# Patient Record
Sex: Male | Born: 1935 | Race: White | Hispanic: No
Health system: Southern US, Community
[De-identification: ages and names within clinical notes are randomized; demographics above are authoritative.]

---

## 2005-01-18 ENCOUNTER — Ambulatory Visit: Payer: Self-pay

## 2008-02-14 ENCOUNTER — Emergency Department: Payer: Self-pay | Admitting: Unknown Physician Specialty

## 2010-08-13 ENCOUNTER — Emergency Department: Payer: Self-pay | Admitting: Emergency Medicine

## 2010-09-18 ENCOUNTER — Ambulatory Visit: Payer: Self-pay | Admitting: Urology

## 2010-12-09 ENCOUNTER — Observation Stay: Payer: Self-pay | Admitting: Family Medicine

## 2011-06-18 ENCOUNTER — Emergency Department: Payer: Self-pay | Admitting: *Deleted

## 2012-09-18 IMAGING — CT CT HEAD WITHOUT CONTRAST
2 series · 16 of 30 positions shown, 20 images · non-contrast
Comparison: none

REASON FOR EXAM: headache
COMMENTS:

[Series 2: without · axial · non-contrast · 0.45mm/px · z∈[-122,+8]mm · 13 of 32 slices shown, 17 images]
[im 3/32  brain]
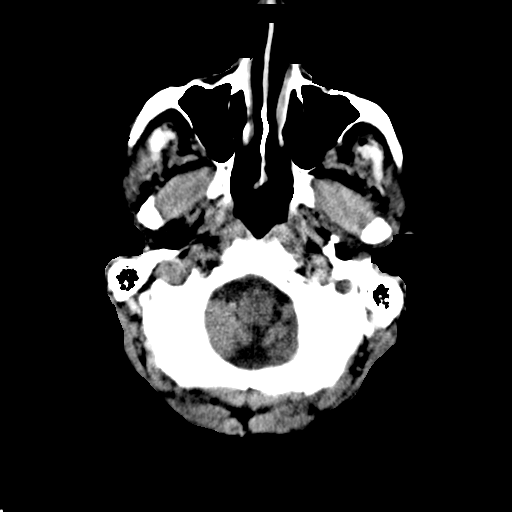
[im 3/32  bone]
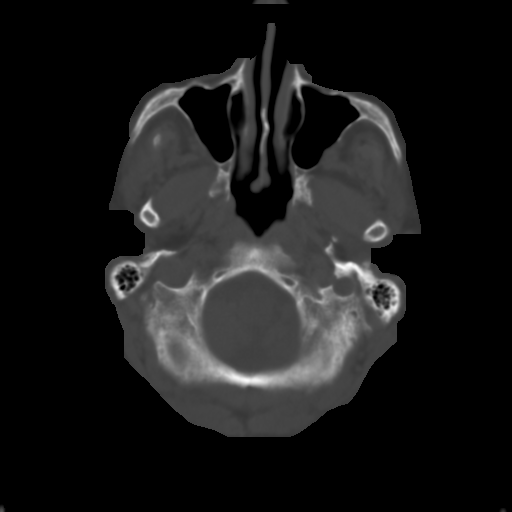
[im 5/32  brain]
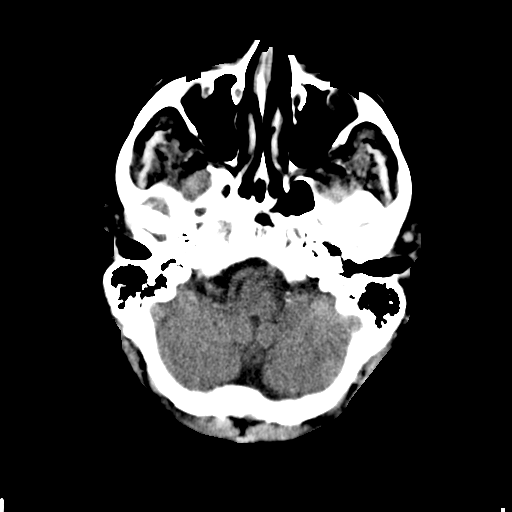
[im 7/32  brain]
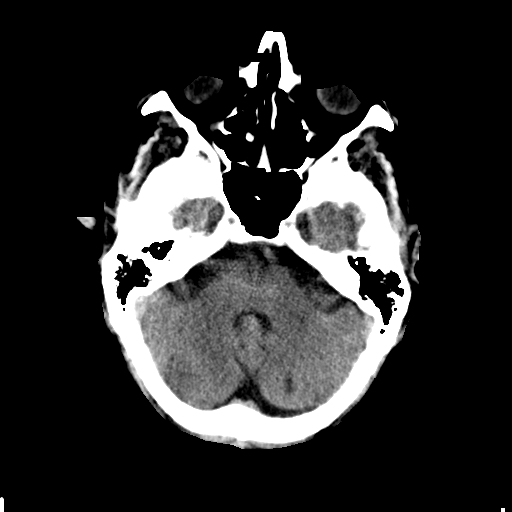
[im 9/32  brain]
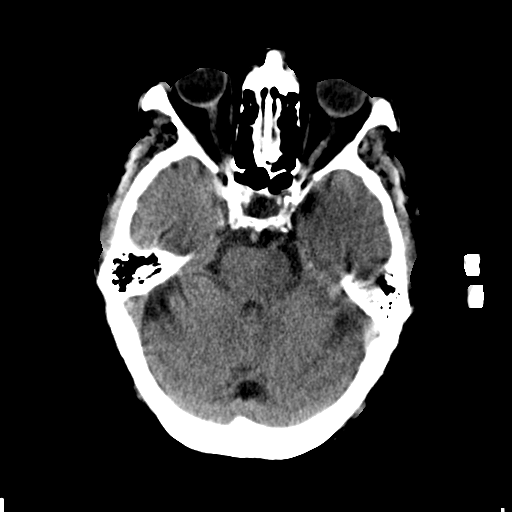
[im 12/32  brain]
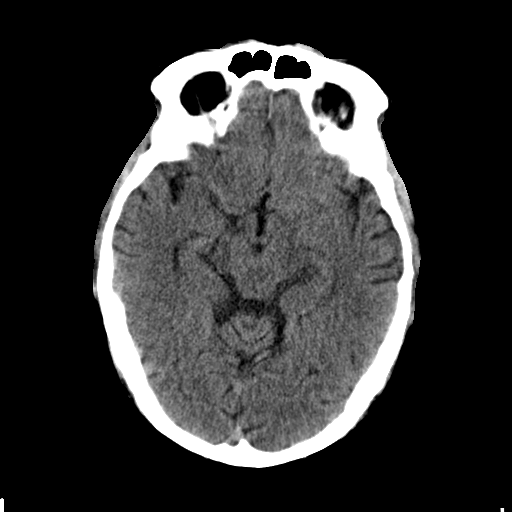
[im 12/32  bone]
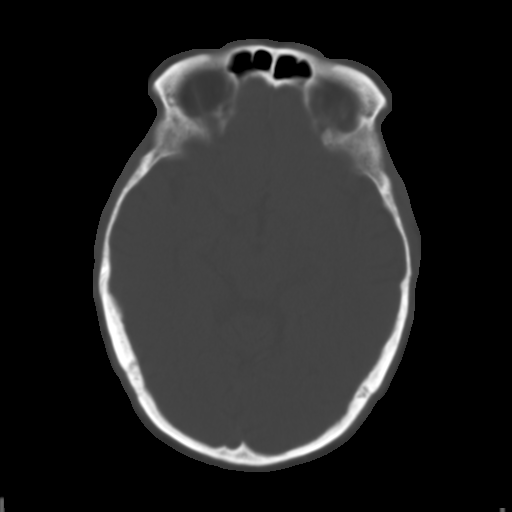
[im 14/32  brain]
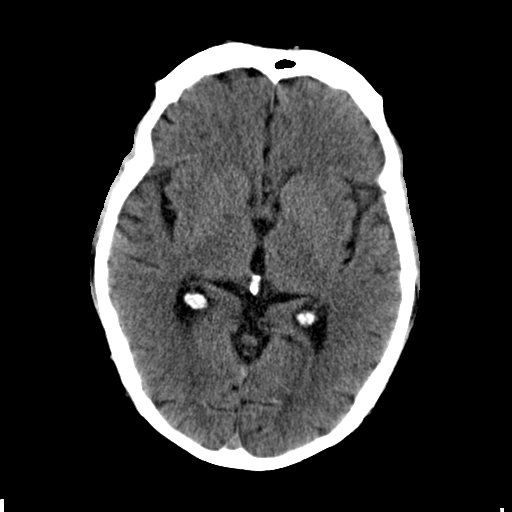
[im 16/32  brain]
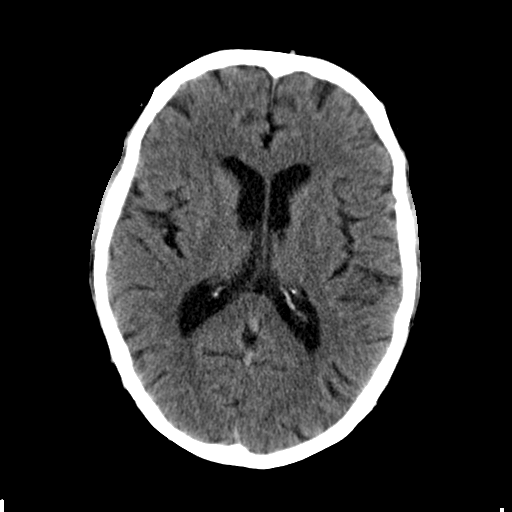
[im 18/32  brain]
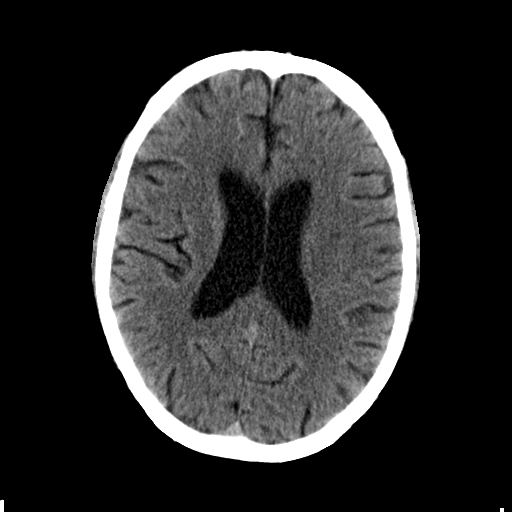
[im 20/32  brain]
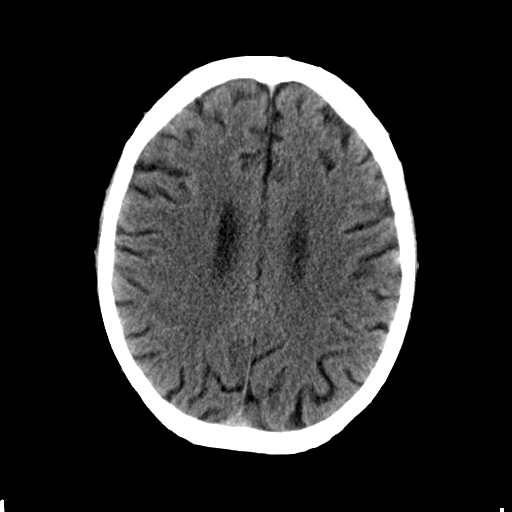
[im 20/32  bone]
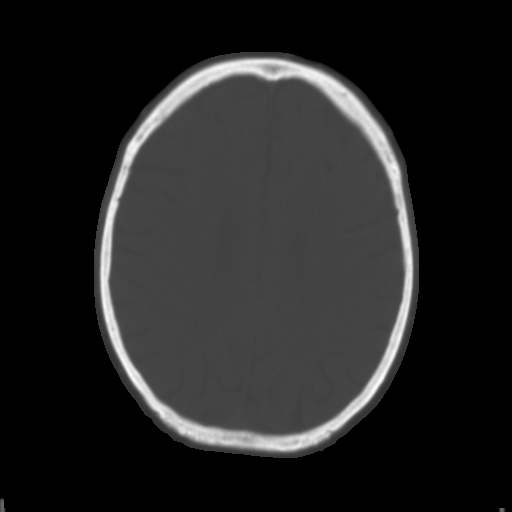
[im 23/32  brain]
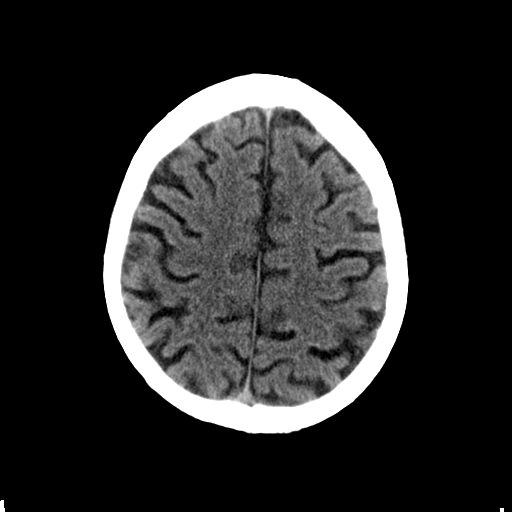
[im 25/32  brain]
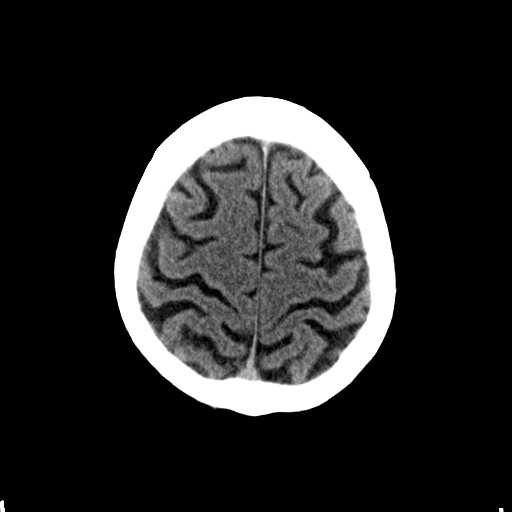
[im 27/32  brain]
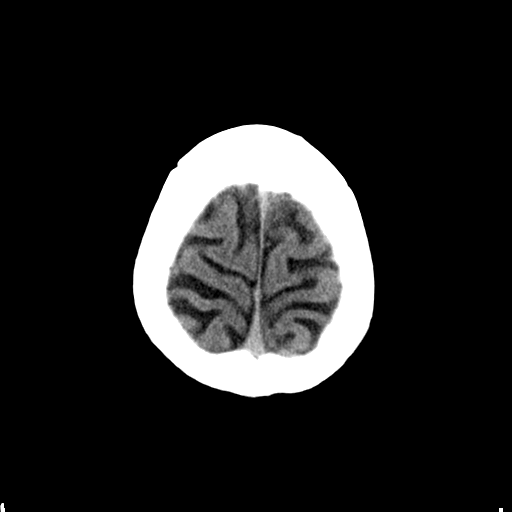
[im 29/32  brain]
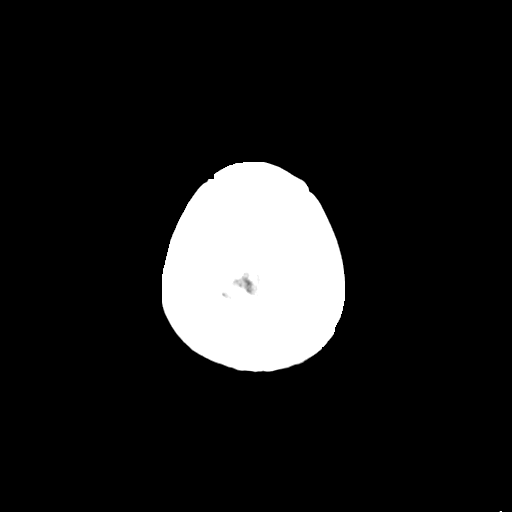
[im 29/32  bone]
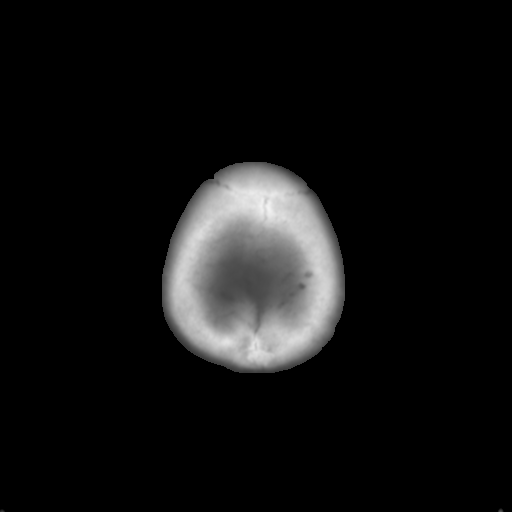

[Series 3: bone · axial · 0.45mm/px · z∈[-122,-78]mm · 3 of 32 slices shown]
[im 3/32  bone]
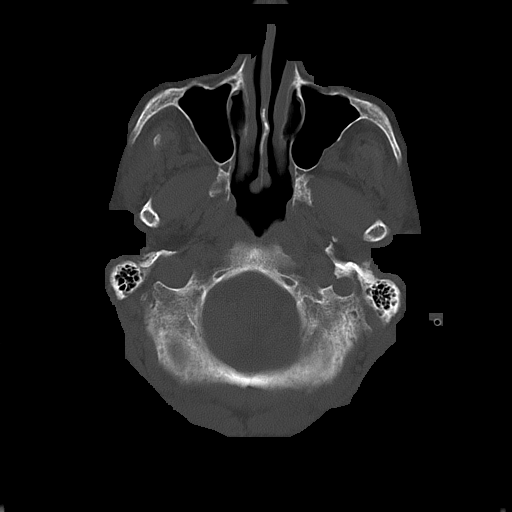
[im 7/32  bone]
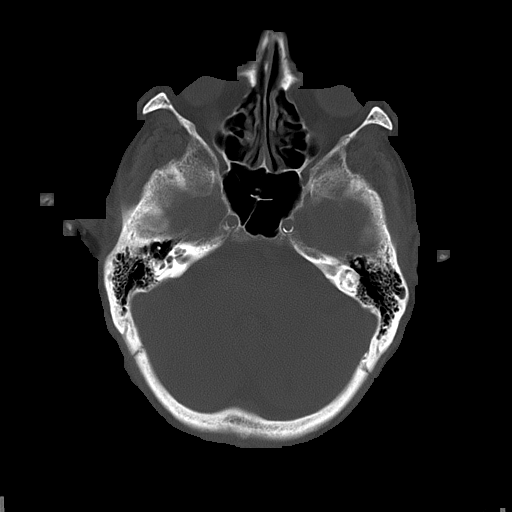
[im 12/32  bone]
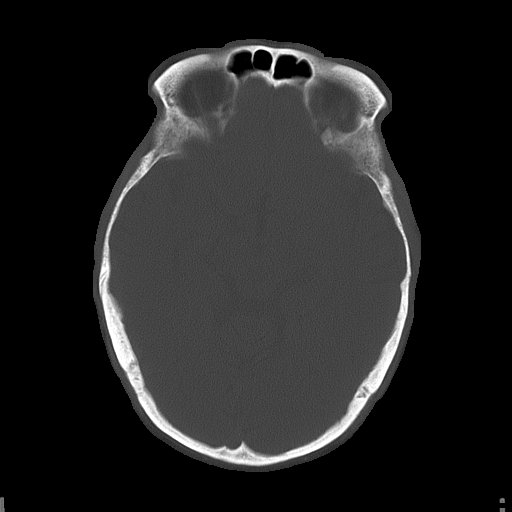

[16 of 30 positions shown; findings below may reference images not displayed]

PROCEDURE:     CT  - CT HEAD WITHOUT CONTRAST  - December 08, 2010 [DATE]

RESULT:     Noncontrast emergent CT of the brain is performed. There is no
previous exam for comparison.

There is prominence of the ventricles and sulci consistent with atrophy.
There appears to be an old small lucency in the left cerebellar hemisphere
suggestive of a tiny old infarct. There is no evidence of intracranial
hemorrhage, mass or mass effect. There is normal aeration of the sinuses and
mastoid air cells. The calvarium appears intact.
IMPRESSION: 1.     Changes of atrophy with an old left cerebellar infarct that is very
small.
2.     No acute intracranial abnormality evident.

## 2013-02-17 LAB — CBC WITH DIFFERENTIAL/PLATELET
Eosinophil #: 0 10*3/uL (ref 0.0–0.7)
Eosinophil %: 0.1 %
HCT: 19.5 % — ABNORMAL LOW (ref 40.0–52.0)
HGB: 6.5 g/dL — ABNORMAL LOW (ref 13.0–18.0)
Lymphocyte #: 0.6 10*3/uL — ABNORMAL LOW (ref 1.0–3.6)
MCH: 31.9 pg (ref 26.0–34.0)
MCHC: 33.4 g/dL (ref 32.0–36.0)
MCV: 96 fL (ref 80–100)
Monocyte #: 1 x10 3/mm (ref 0.2–1.0)
Monocyte %: 7.1 %
Neutrophil #: 11.9 10*3/uL — ABNORMAL HIGH (ref 1.4–6.5)
Neutrophil %: 87.8 %
Platelet: 211 10*3/uL (ref 150–440)
RBC: 2.05 10*6/uL — ABNORMAL LOW (ref 4.40–5.90)
WBC: 13.5 10*3/uL — ABNORMAL HIGH (ref 3.8–10.6)

## 2013-02-17 LAB — URINALYSIS, COMPLETE
Bilirubin,UR: NEGATIVE
Blood: NEGATIVE
Glucose,UR: NEGATIVE mg/dL (ref 0–75)
Hyaline Cast: 10
Leukocyte Esterase: NEGATIVE
Ph: 5 (ref 4.5–8.0)
Protein: NEGATIVE
RBC,UR: <1 /HPF (ref 0–5)
Squamous Epithelial: <1

## 2013-02-17 LAB — DIGOXIN LEVEL: Digoxin: 3.2 ng/mL

## 2013-02-17 LAB — COMPREHENSIVE METABOLIC PANEL
Albumin: 3.5 g/dL (ref 3.4–5.0)
Alkaline Phosphatase: 58 U/L (ref 50–136)
Anion Gap: 11 (ref 7–16)
BUN: 103 mg/dL — ABNORMAL HIGH (ref 7–18)
Bilirubin,Total: 0.3 mg/dL (ref 0.2–1.0)
Chloride: 95 mmol/L — ABNORMAL LOW (ref 98–107)
Co2: 27 mmol/L (ref 21–32)
EGFR (African American): 37 — ABNORMAL LOW
EGFR (Non-African Amer.): 32 — ABNORMAL LOW
Glucose: 130 mg/dL — ABNORMAL HIGH (ref 65–99)
Potassium: 3.7 mmol/L (ref 3.5–5.1)
SGOT(AST): 35 U/L (ref 15–37)
SGPT (ALT): 28 U/L (ref 12–78)
Sodium: 133 mmol/L — ABNORMAL LOW (ref 136–145)

## 2013-02-17 LAB — PROTIME-INR
INR: 5
Prothrombin Time: 43.8 secs — ABNORMAL HIGH (ref 11.5–14.7)

## 2013-02-17 LAB — TSH: Thyroid Stimulating Horm: 5.54 u[IU]/mL — ABNORMAL HIGH

## 2013-02-17 LAB — TROPONIN I: Troponin-I: 0.26 ng/mL — ABNORMAL HIGH

## 2013-02-18 ENCOUNTER — Inpatient Hospital Stay: Payer: Self-pay | Admitting: Internal Medicine

## 2013-02-18 LAB — CBC WITH DIFFERENTIAL/PLATELET
Basophil #: 0.1 10*3/uL (ref 0.0–0.1)
Eosinophil #: 0 10*3/uL (ref 0.0–0.7)
Eosinophil %: 0.1 %
MCH: 31.2 pg (ref 26.0–34.0)
MCV: 93 fL (ref 80–100)
Monocyte #: 2 x10 3/mm — ABNORMAL HIGH (ref 0.2–1.0)
Monocyte %: 10.6 %
Neutrophil %: 85.1 %

## 2013-02-18 LAB — PROTIME-INR
INR: 1.4
Prothrombin Time: 17.3 secs — ABNORMAL HIGH (ref 11.5–14.7)

## 2013-02-18 LAB — CK TOTAL AND CKMB (NOT AT ARMC)
CK, Total: 99 U/L (ref 35–232)
CK-MB: 11.3 ng/mL — ABNORMAL HIGH (ref 0.5–3.6)

## 2013-02-18 LAB — DIGOXIN LEVEL: Digoxin: 0.2 ng/mL

## 2013-02-18 LAB — TROPONIN I: Troponin-I: 0.32 ng/mL — ABNORMAL HIGH

## 2013-02-18 LAB — CBC
MCHC: 32.9 g/dL (ref 32.0–36.0)
Platelet: 156 10*3/uL (ref 150–440)
RBC: 2.01 10*6/uL — ABNORMAL LOW (ref 4.40–5.90)

## 2013-02-19 LAB — BASIC METABOLIC PANEL
Anion Gap: 7 (ref 7–16)
BUN: 77 mg/dL — ABNORMAL HIGH (ref 7–18)
Chloride: 101 mmol/L (ref 98–107)
Co2: 28 mmol/L (ref 21–32)
EGFR (Non-African Amer.): 35 — ABNORMAL LOW
Glucose: 115 mg/dL — ABNORMAL HIGH (ref 65–99)
Osmolality: 296 (ref 275–301)
Sodium: 136 mmol/L (ref 136–145)

## 2013-02-19 LAB — CBC WITH DIFFERENTIAL/PLATELET
Basophil %: 0.3 %
Eosinophil #: 0.1 10*3/uL (ref 0.0–0.7)
Eosinophil %: 0.4 %
HCT: 25 % — ABNORMAL LOW (ref 40.0–52.0)
HGB: 8.6 g/dL — ABNORMAL LOW (ref 13.0–18.0)
Lymphocyte %: 5.9 %
MCH: 31.3 pg (ref 26.0–34.0)
MCHC: 34.3 g/dL (ref 32.0–36.0)
MCV: 91 fL (ref 80–100)
Neutrophil #: 14.2 10*3/uL — ABNORMAL HIGH (ref 1.4–6.5)
Platelet: 184 10*3/uL (ref 150–440)
RBC: 2.74 10*6/uL — ABNORMAL LOW (ref 4.40–5.90)
RDW: 16.8 % — ABNORMAL HIGH (ref 11.5–14.5)
WBC: 17.5 10*3/uL — ABNORMAL HIGH (ref 3.8–10.6)

## 2013-02-19 LAB — HEMATOCRIT: HCT: 25.1 % — ABNORMAL LOW (ref 40.0–52.0)

## 2013-02-19 LAB — HEMOGLOBIN: HGB: 8.4 g/dL — ABNORMAL LOW (ref 13.0–18.0)

## 2013-02-19 LAB — PROTIME-INR
INR: 1.2
INR: 1.2
Prothrombin Time: 15.3 secs — ABNORMAL HIGH (ref 11.5–14.7)
Prothrombin Time: 15.4 secs — ABNORMAL HIGH (ref 11.5–14.7)

## 2013-02-20 LAB — CBC WITH DIFFERENTIAL/PLATELET
Basophil #: 0 10*3/uL (ref 0.0–0.1)
Basophil %: 0.3 %
Eosinophil #: 0.1 10*3/uL (ref 0.0–0.7)
Eosinophil %: 0.7 %
HGB: 9 g/dL — ABNORMAL LOW (ref 13.0–18.0)
Lymphocyte #: 0.5 10*3/uL — ABNORMAL LOW (ref 1.0–3.6)
MCH: 31.5 pg (ref 26.0–34.0)
MCHC: 33.8 g/dL (ref 32.0–36.0)
Monocyte %: 10.1 %
Neutrophil #: 13.3 10*3/uL — ABNORMAL HIGH (ref 1.4–6.5)
Platelet: 180 10*3/uL (ref 150–440)
RDW: 17.1 % — ABNORMAL HIGH (ref 11.5–14.5)

## 2013-02-20 LAB — BASIC METABOLIC PANEL
Anion Gap: 5 — ABNORMAL LOW (ref 7–16)
BUN: 45 mg/dL — ABNORMAL HIGH (ref 7–18)
Chloride: 102 mmol/L (ref 98–107)
Co2: 30 mmol/L (ref 21–32)
EGFR (African American): >60
Osmolality: 288 (ref 275–301)
Sodium: 137 mmol/L (ref 136–145)

## 2013-02-21 LAB — BASIC METABOLIC PANEL
BUN: 28 mg/dL — ABNORMAL HIGH (ref 7–18)
Chloride: 99 mmol/L (ref 98–107)
EGFR (African American): >60
EGFR (Non-African Amer.): >60
Osmolality: 277 (ref 275–301)
Potassium: 3.9 mmol/L (ref 3.5–5.1)
Sodium: 135 mmol/L — ABNORMAL LOW (ref 136–145)

## 2013-02-21 LAB — CBC WITH DIFFERENTIAL/PLATELET
Basophil %: 0.2 %
Eosinophil #: 0.2 10*3/uL (ref 0.0–0.7)
Eosinophil %: 1.2 %
HCT: 24.5 % — ABNORMAL LOW (ref 40.0–52.0)
Lymphocyte #: 0.5 10*3/uL — ABNORMAL LOW (ref 1.0–3.6)
Lymphocyte %: 3.7 %
MCHC: 32.9 g/dL (ref 32.0–36.0)
MCV: 93 fL (ref 80–100)
Monocyte #: 1.5 x10 3/mm — ABNORMAL HIGH (ref 0.2–1.0)
RBC: 2.62 10*6/uL — ABNORMAL LOW (ref 4.40–5.90)
RDW: 18.8 % — ABNORMAL HIGH (ref 11.5–14.5)
WBC: 14.4 10*3/uL — ABNORMAL HIGH (ref 3.8–10.6)

## 2013-02-22 LAB — CBC WITH DIFFERENTIAL/PLATELET
Basophil #: 0 10*3/uL (ref 0.0–0.1)
Basophil %: 0.2 %
Eosinophil #: 0.5 10*3/uL (ref 0.0–0.7)
HCT: 23.3 % — ABNORMAL LOW (ref 40.0–52.0)
HGB: 7.6 g/dL — ABNORMAL LOW (ref 13.0–18.0)
Lymphocyte #: 0.6 10*3/uL — ABNORMAL LOW (ref 1.0–3.6)
MCH: 30.8 pg (ref 26.0–34.0)
MCV: 94 fL (ref 80–100)
Neutrophil #: 8.7 10*3/uL — ABNORMAL HIGH (ref 1.4–6.5)
Platelet: 165 10*3/uL (ref 150–440)
WBC: 11 10*3/uL — ABNORMAL HIGH (ref 3.8–10.6)

## 2013-02-22 LAB — BASIC METABOLIC PANEL
Anion Gap: 1 — ABNORMAL LOW (ref 7–16)
Co2: 33 mmol/L — ABNORMAL HIGH (ref 21–32)
Creatinine: 0.97 mg/dL (ref 0.60–1.30)
EGFR (Non-African Amer.): >60
Glucose: 100 mg/dL — ABNORMAL HIGH (ref 65–99)
Osmolality: 269 (ref 275–301)

## 2013-02-23 LAB — CBC WITH DIFFERENTIAL/PLATELET
Basophil #: 0 10*3/uL (ref 0.0–0.1)
Basophil %: 0.5 %
Eosinophil #: 0.8 10*3/uL — ABNORMAL HIGH (ref 0.0–0.7)
Eosinophil %: 7.6 %
HGB: 8.2 g/dL — ABNORMAL LOW (ref 13.0–18.0)
Lymphocyte %: 5.8 %
MCHC: 33.8 g/dL (ref 32.0–36.0)
Monocyte %: 9.5 %
Neutrophil #: 7.7 10*3/uL — ABNORMAL HIGH (ref 1.4–6.5)
Neutrophil %: 76.6 %
Platelet: 165 10*3/uL (ref 150–440)
RBC: 2.6 10*6/uL — ABNORMAL LOW (ref 4.40–5.90)
RDW: 17.5 % — ABNORMAL HIGH (ref 11.5–14.5)
WBC: 10.1 10*3/uL (ref 3.8–10.6)

## 2013-02-23 LAB — BASIC METABOLIC PANEL
Chloride: 98 mmol/L (ref 98–107)
Co2: 32 mmol/L (ref 21–32)
Osmolality: 270 (ref 275–301)

## 2013-02-25 LAB — HEMOGLOBIN: HGB: 8.6 g/dL — ABNORMAL LOW (ref 13.0–18.0)

## 2013-02-26 ENCOUNTER — Encounter: Payer: Self-pay | Admitting: Internal Medicine

## 2013-03-12 ENCOUNTER — Encounter: Payer: Self-pay | Admitting: Internal Medicine

## 2013-03-23 LAB — CBC WITH DIFFERENTIAL/PLATELET
Basophil #: 0 10*3/uL (ref 0.0–0.1)
Basophil %: 0.2 %
Eosinophil %: 0.3 %
HCT: 32.2 % — ABNORMAL LOW (ref 40.0–52.0)
HGB: 10.5 g/dL — ABNORMAL LOW (ref 13.0–18.0)
Lymphocyte %: 4.1 %
MCH: 27.6 pg (ref 26.0–34.0)
MCHC: 32.5 g/dL (ref 32.0–36.0)
Monocyte #: 0.3 x10 3/mm (ref 0.2–1.0)
Neutrophil #: 8.2 10*3/uL — ABNORMAL HIGH (ref 1.4–6.5)
Platelet: 264 10*3/uL (ref 150–440)
RBC: 3.79 10*6/uL — ABNORMAL LOW (ref 4.40–5.90)
WBC: 8.9 10*3/uL (ref 3.8–10.6)

## 2013-03-23 LAB — BASIC METABOLIC PANEL
BUN: 26 mg/dL — ABNORMAL HIGH (ref 7–18)
Calcium, Total: 9.4 mg/dL (ref 8.5–10.1)
Chloride: 101 mmol/L (ref 98–107)
Creatinine: 1.07 mg/dL (ref 0.60–1.30)
EGFR (Non-African Amer.): >60
Osmolality: 279 (ref 275–301)
Potassium: 4.9 mmol/L (ref 3.5–5.1)

## 2013-05-12 DEATH — deceased

## 2014-11-29 IMAGING — DX DG CHEST 1V PORT
1 series · 2 of 2 positions shown · non-contrast
Comparison: none

REASON FOR EXAM: cp
COMMENTS:

PROCEDURE:     DXR - DXR PORTABLE CHEST SINGLE VIEW  - February 17, 2013  [DATE]
RESULT:     Comparison: None.

[Series 1: ap · 0.17mm/px · 2 of 2 slices shown]
[im 1/2]
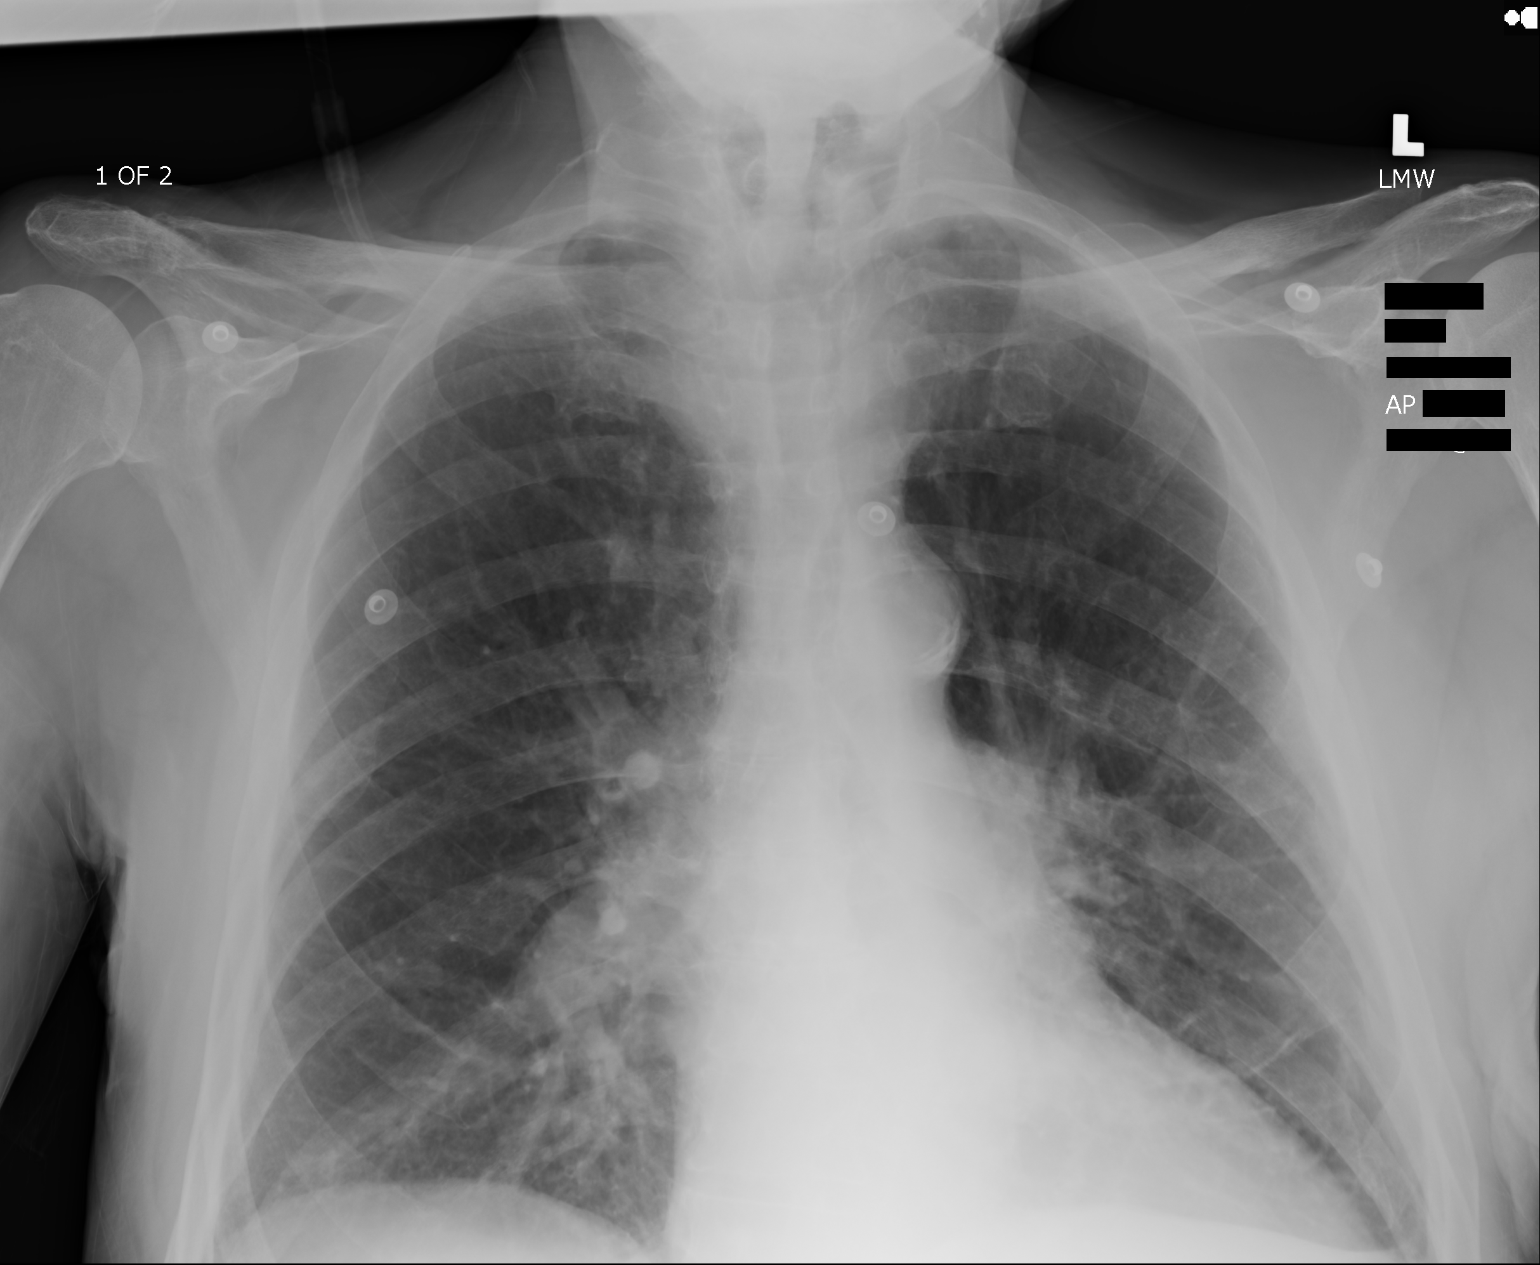
[im 2/2]
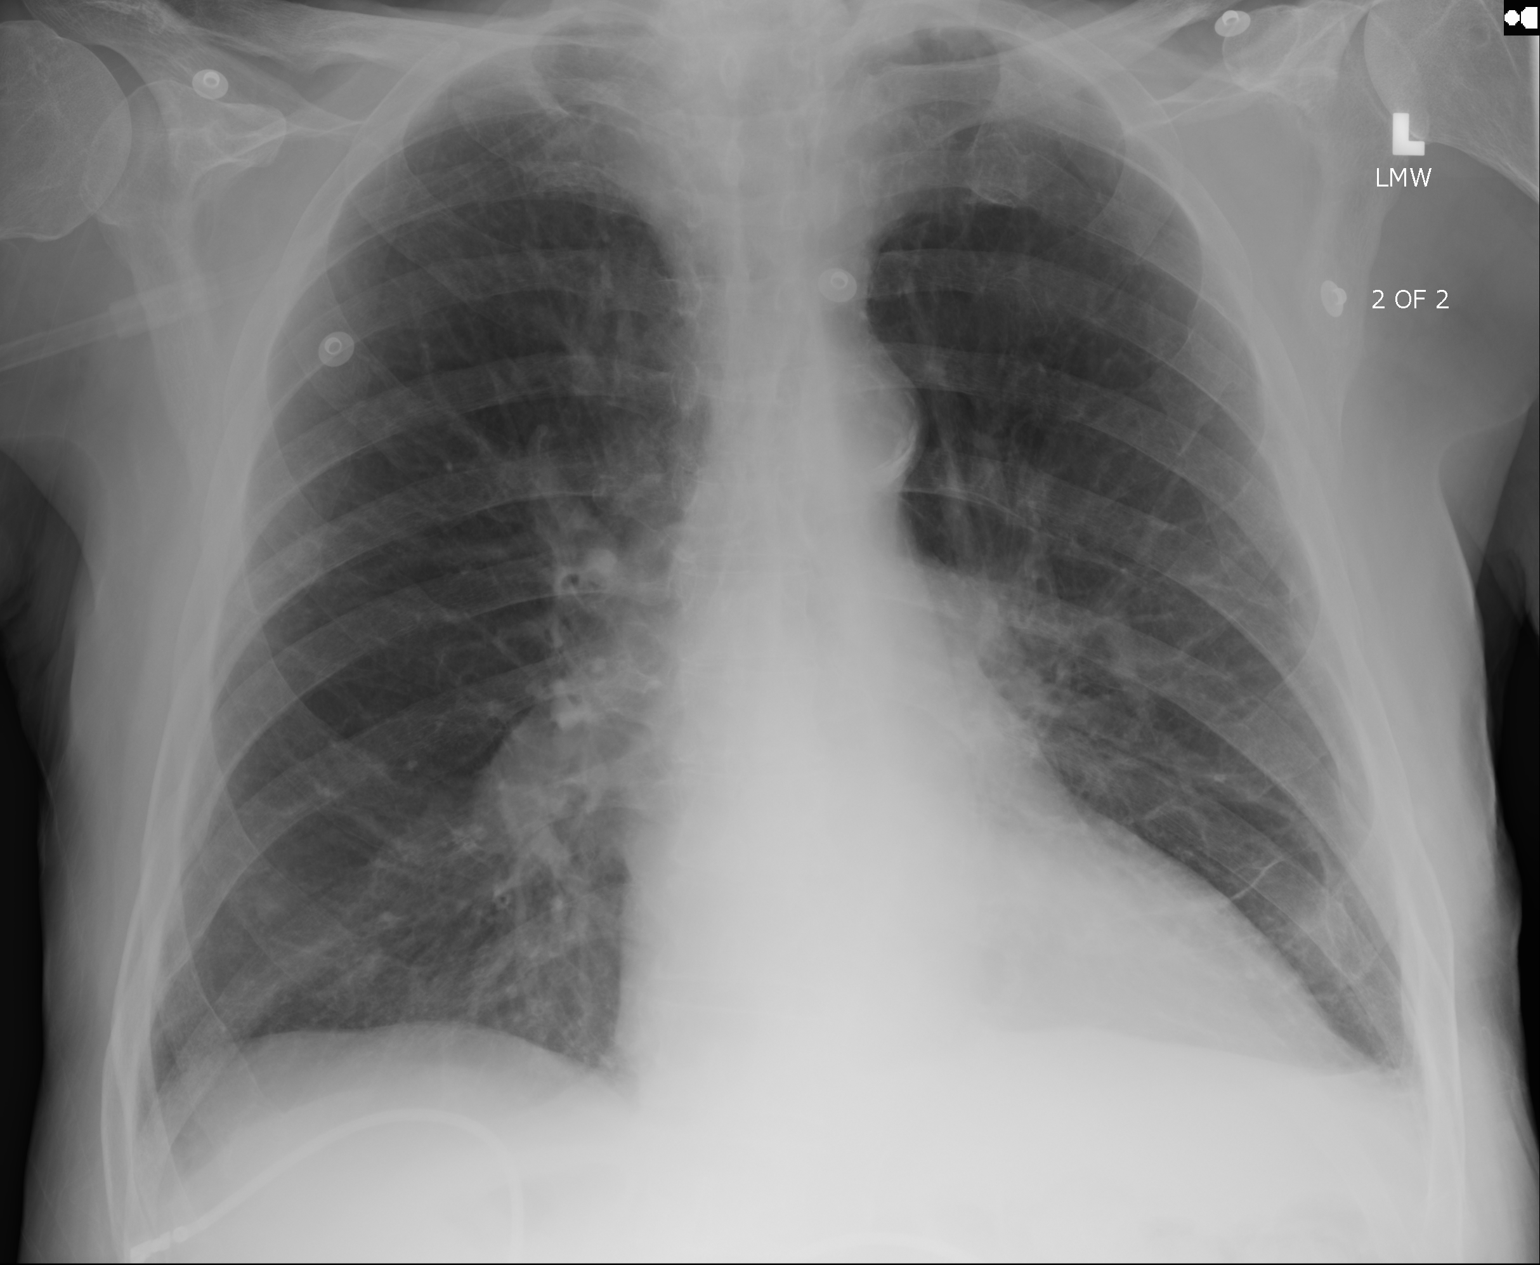

[2 of 2 positions shown; findings below may reference images not displayed]

FINDINGS: Heart size upper limits of normal. Right hilar prominence is likely related
to patient rotation. Prominence right superior mediastinum is likely due to
patient rotation and tortuous great vessels. The lungs are hyperinflated.
Minimal opacities in the left lower lung are likely secondary to atelectasis
and scarring.
IMPRESSION: 1. Minimal opacities in left lower lung are likely secondary to atelectasis
scarring. Followup PA and lateral chest radiograph is recommended.
2. Right hilar prominence is likely related to patient positioning and AP
technique. However, followup PA and lateral chest radiograph is recommended.

## 2015-03-04 NOTE — Consult Note (Signed)
CC: GI bleed with coumadin.  His hgb has been stable. eating ok.  Can advance to mechanical soft diet if not already on it. I will sign off, reconsult if I can be of service.  Electronic Signatures: Scot JunElliott, Daviel Allegretto T (MD)  (Signed on 15-Apr-14 17:10)  Authored  Last Updated: 15-Apr-14 17:10 by Scot JunElliott, Kevina Piloto T (MD)

## 2015-03-04 NOTE — Consult Note (Signed)
CC; GI bleeding from coagulopathy, pt hgb better up to 7.9, PT nl.  Color better, still with exp wheezing but does not appear in distress, he has been wheezing for over 10 years.  Will write for more SVN.  Due to improvement and cardiopulmonary risks will not do EGD but follow on full liquid diet for a few days and current meds.  Electronic Signatures: Scot JunElliott, Robert T (MD)  (Signed on 10-Apr-14 18:04)  Authored  Last Updated: 10-Apr-14 18:04 by Scot JunElliott, Robert T (MD)

## 2015-03-04 NOTE — Consult Note (Signed)
Pt CC GI bleed due to coumadin.  Pt off levophed, BP stable, eating well, chest without wheezing like it was before. Hgb 8.2 after a unit yesterday.  When he goes home should go on bid PPI and very soft diet.  Can move to floor if stays off iv pressors.  Electronic Signatures: Scot JunElliott, Yarden Hillis T (MD)  (Signed on 14-Apr-14 14:34)  Authored  Last Updated: 14-Apr-14 14:34 by Scot JunElliott, Nainoa Woldt T (MD)

## 2015-03-04 NOTE — Consult Note (Signed)
CC: GI bleed.  Pt has higher hgb up to 7.9,  INR down to 1.4.  His severe aortic stenosis and elevated troponin place him at high risk for endoscopic procdure.  Since he has responded well to conservative treatment with transfusions and reversal of coumadin I will watch him for now and hold on EGD if no further bleeding occurs.  Electronic Signatures: Scot JunElliott, Robert T (MD)  (Signed on 09-Apr-14 16:20)  Authored  Last Updated: 09-Apr-14 16:20 by Scot JunElliott, Robert T (MD)

## 2015-03-04 NOTE — H&P (Signed)
PATIENT NAME:  Robert Harper, VI MR#:  [id number] DATE OF BIRTH:  1928-05-04  DATE OF ADMISSION:  02/18/2013  PRIMARY CARE PHYSICIAN:  Dr. Yates Decamp.   REFERRING PHYSICIAN:  Dr. Darnelle Catalan.   CHIEF COMPLAINT:  Generalized weakness, abdominal pain, chest pain, shortness of breath.   HISTORY OF PRESENT ILLNESS:  The patient is an 81 year old white male with a past medical history of hypertension, osteoarthritis, atrial fibrillation, COPD, presented to the Emergency Department with complaints of generalized weakness, nausea, chest pain, abdominal pain, generalized body aches for the last 3 to 4 days.  The patient had recent increase in Coumadin dose about three weeks back.  However, the patient has not gone to check his Coumadin levels.  In the Emergency Department, patient was found to have hemoglobin 6.5.  The patient states has been having black stools for the last 2 to 3 days.  Also has been experiencing chest pain on the left side of the chest.  In the Emergency Department, patient was found to have a troponin of 0.2.  Was also found to have elevated digoxin levels.  At the time of the presentation, patient was hypotensive with a systolic blood pressure in the 80s.  After receiving 1 liter of fluids, patient's blood pressure improved to 130s, however it continues to trend down.  Has been nauseated and unable to eat food for the last two days.  The patient at baseline has poor functional status, mostly sits in the recliner most of the day.  The patient was found to have INR of 5.2.  The patient received 1 unit of FFP in the Emergency Department.  The patient was also found to have elevated digoxin level of 3.5.  Concerning this, discussed with the cardiology Dr. Juliann Pares who recommended to keep the potassium levels above 4.  However, the patient continued to have junctional rhythm in the Emergency Department.   PAST MEDICAL HISTORY: 1.  Hypertension.  2.  Chronic obstructive pulmonary disease.  3.   Benign prostatic hypertrophy.  4.  Gout.  5.  Osteoarthritis.  6.  Atrial fibrillation on chronic anticoagulation.   PAST SURGICAL HISTORY:  Prostate surgery.   ALLERGIES:  MORPHINE AND SOME LAUNDRY SOAPS.   HOME MEDICATIONS: 1.  Coumadin 2.5 mg 1 tablet alternating with 1-1/2 tablets every other day.  2.  Ventolin 2 puffs inhalations as needed.  3.  Tylenol 2 tablets once a day.  4.  Tramadol 50 mg every 8 hours as needed.  5.  Spiriva 18 mcg 1 capsule once a day.  6.  Ramipril 10 mg once a day.  7.  Paroxetine 20 mg once a day.  8.  Imdur 10 mg 1 tablet 2 times a day.  9.  Lasix 40 mg 2 tablets once a day.  10.  Tamsulosin 1 capsule once a day.  11.  Digoxin 250 mcg once a day.  12.  Crestor 20 mg 2 tablets once a day.  13.  Benadryl 1 tablet 3 times a day.  14.  Alprazolam 0.25 mg 3 times a day.  15.  Allopurinol 300 mg once a day.  16.  Combivent inhalations 3 times a day.  17.  Vicodin 5/500 1 tablet every 6 hours as needed.   SOCIAL HISTORY:  Smoked remotely, quit about 35 years back.  Unable to quantify.  Denies drinking alcohol or using illicit drugs.  Currently lives with his daughter.  Daughter is the medical power of attorney, however patient could not make a  decision about his living will.   FAMILY HISTORY:  Father had problems consistent with diabetes mellitus.   REVIEW OF SYSTEMS: CONSTITUTIONAL:  Generalized weakness, weight loss of 130 pounds in the last 2 to 3 years.  EYES:  No blurred vision, no redness.  EARS, NOSE, THROAT:  Mild hearing loss, no sore throat.  RESPIRATORY:  Has shortness of breath.  CARDIOVASCULAR:  Chest pain, dyspnea on exertion.  GASTROINTESTINAL:  Has been nauseated and GI, melena.  GENITOURINARY:  Now dysuria or hematuria.  ENDOCRINE:  No polyuria or polydipsia.  HEMATOLOGIC:  Having GI bleed and easy bruising.  SKIN:  No rash or lesions.  MUSCULOSKELETAL:  Has chronic pain, has history of osteoarthritis.  NEUROLOGIC:  No numbness  or weakness.  PSYCHIATRIC:  Has history of depression.   PHYSICAL EXAMINATION: GENERAL:  This is a frail-looking, age-appropriate male, lying down in the bed, looks lethargic.  VITAL SIGNS:  Temperature 98.5, pulse 88, blood pressure currently 88/38, respiratory rate of 20, oxygen saturations 100% on 2 liters of oxygen.  HEENT:  Head normocephalic, atraumatic.  Eyes, no scleral icterus.  Conjunctivae normal.  Pupils equal and react to light.  Extraocular movements intact.  Mucous membranes moist.  No pharyngeal erythema.  NECK:  Supple.  No lymphadenopathy.  No JVD.  No carotid bruit.  No thyromegaly.  CHEST:  Has no focal tenderness.  Lungs bilateral clear to auscultation.  HEART:  S1, S2, irregular.  No murmurs are heard.  ABDOMEN:  Bowel sounds plus.  Soft, nontender, nondistended.  No hepatosplenomegaly.  EXTREMITIES:  No pedal edema.  Pulses 2+.  MUSCULOSKELETAL:  Complains of back pain.  Good range of motion in all the extremities.  SKIN:  No rash or lesions.  LYMPHATIC:  No cervical, axillary or inguinal lymphadenopathy.  NEUROLOGIC:  The patient is alert, oriented to place, person and time.  Cranial nerves II through XII intact.  Motor 5 by 5 in upper and lower extremities.  Sensory intact.  PSYCHIATRIC:  Has somewhat depressed mood secondary to lethargy.   LABORATORY DATA:  CMP, BUN 103, creatinine of 1.93.  Digoxin 3.2, troponin 0.26, TSH 5.54, PT 43, INR of 5.  UA negative for nitrites and leukocyte esterase.  CBC, WBC of 13.5, hemoglobin 6.5, platelet count of 211.   ASSESSMENT AND PLAN:  The patient is an 81 year old male comes to the Emergency Department with 2 to 3 days of nausea, black stools and generalized weakness, chest pain.  Is found to have a hemoglobin of 6.5, elevated PT/INR, and elevated digoxin levels.  1.  Gastrointestinal bleed, most likely upper gastrointestinal bleed.  The patient has elevated BUN of 105.  As the patient is currently unstable, admit the patient to  the ICU, give vitamin K 10 mg IV with 2 units of fresh frozen plasma.  Repeat the PT/INR in 2 hours.  Keep the patient nothing by mouth.  Keep the patient on IV Protonix drip.  Discussed with Dr. Mechele Collin who stated that the patient's INR needs to be a normal level before doing the endoscopy.  Transfuse 3 units of packed RBC.  Will continue to follow up with hemoglobin and hematocrit.  2.  Anemia secondary to acute blood loss from the gastrointestinal bleed.  Transfuse packed red blood cells.  3.  Elevated troponin.  The patient has a significant drop in hemoglobin which might have caused demand ischemia.  Continue to follow up with cardiac enzymes x 3.  We will obtain echocardiogram.  Per daughter,  the patient did not have any previous history of myocardial infarction.  4.  High digoxin level.  The patient is currently having a junctional rhythm, being hypotensive.  The patient was given 40 mEq of potassium by mouth by the ER physician.  We will continue with potassium through IV fluids.  Continue to follow up with Dig levels daily.  We will give the digibind concerning about the patient's arrhythmias.  The patient's daughter is concerned about patient might be taking excessive medication.  Has not taken digoxin for the last 3 to 4 days as patient ran out of the medication.  5.  Failure to thrive, lost about 130 pounds in the last 2 to 3 years.  We will obtain nutritionist consult.  6.  Acute on chronic renal insufficiency.  We will continue with IV fluids and follow up.  7.  Atrial fibrillation.  Rate is controlled.  We will hold digoxin at this time.  8.  Hypotension.  This could be a combination of dehydration, gastrointestinal bleed and continued to take the hypertensive medication.  We will hold all hypertensive medication.  Continue to give IV fluids.  If the patient continues to have low blood pressure we will start the patient on pressors.  9.  Chronic obstructive pulmonary disease.  No current  issues.  Continue with as needed inhalations.  10.  Debility.  We will involve the physical therapy, occupational therapy.  11.  Keep the patient on deep vein thrombosis prophylaxis with SCDs.   TIME SPENT:  70 minutes.     ____________________________ Susa GriffinsPadmaja Shakhia Gramajo, MD pv:ea D: 02/18/2013 01:50:35 ET T: 02/18/2013 03:37:55 ET JOB#: [id number]  cc: Susa GriffinsPadmaja Kida Digiulio, MD, <Dictator> John B. Danne HarborWalker III, MD Clerance LavPADMAJA Marlow Hendrie MD ELECTRONICALLY SIGNED 02/26/2013 0:37

## 2015-03-04 NOTE — Consult Note (Signed)
PT CC is UGI bleed due to hypercoagulable state. No further bleeding since treated for hypercoag state and transfused.  Pt Breathing better with no wheezing but exam right after a breathing treatment.  Tol full liquid diet well,  No vomiting, no abd pain.  He has arthritic pain from lying in bed for the last few days. Hgb stable at 9.  WBC 15.  Unable to wean off levophed per nurse.  Will continue his diet and advance to mechanical soft on Sunday.  Abd exam with no masses nor tendernss.  No EGD planned due to cardiopul status unless active bleeding occurs.    Electronic Signatures: Scot JunElliott, Robert T (MD)  (Signed on 11-Apr-14 17:55)  Authored  Last Updated: 11-Apr-14 17:55 by Scot JunElliott, Robert T (MD)

## 2015-03-04 NOTE — Consult Note (Signed)
CC: GI bleeding from over anticoagulation.  Pt with CHF, wheezing after fluid infusion, altered mental status possibly due to Ativan.  Elevated troponin with ST changes of ischemia.  Mortality rate very high.  Recommend cardiology consult.  Cannot do EGD until INR 1.6 or below.  Pt to get more FFP and packed red cells transfusion, serial hgb.  Electronic Signatures: Scot JunElliott, Boyde Grieco T (MD)  (Signed on 09-Apr-14 07:07)  Authored  Last Updated: 09-Apr-14 07:07 by Scot JunElliott, Marcquis Ridlon T (MD)

## 2015-03-04 NOTE — Consult Note (Signed)
CC: GI bleed from coumadin.  Pt still not able to wean from levophed.  Breathing better with no signif wheezing at this time.  Answers questions well.  Since unable to wean and his hgb is 7.6 I think it is reasonable to give him a unit of blood.  Will advance diet to mashed potatoes and scrambled eggs.  Electronic Signatures: Scot JunElliott, Seneca Hoback T (MD)  (Signed on 13-Apr-14 09:21)  Authored  Last Updated: 13-Apr-14 09:21 by Scot JunElliott, Khristin Keleher T (MD)

## 2015-03-04 NOTE — Consult Note (Signed)
Brief Consult Note: Diagnosis: Junctional bradycardia with ventricular bigeminy in setting of GI bleed, dig toxicity, borderline elevated troponin, likely demand suuply ischemia.   Patient was seen by consultant.   Consult note dictated.   Comments: REC  Agree with current therapy, correct anemia, echo, cont to monitor closely, replete K and Mg as necessary.  Electronic Signatures: Marcina MillardParaschos, Wilda Wetherell (MD)  (Signed 09-Apr-14 12:39)  Authored: Brief Consult Note   Last Updated: 09-Apr-14 12:39 by Marcina MillardParaschos, Lindell Renfrew (MD)

## 2015-03-04 NOTE — Consult Note (Signed)
PATIENT NAME:  Robert Harper, Robert Harper MR#:  [id number] DATE OF BIRTH:  15-Apr-1928  DATE OF CONSULTATION:  02/18/2013  REFERRING PHYSICIAN:   CONSULTING PHYSICIAN:  Marcina Millard, MD  PRIMARY CARE PHYSICIAN: Dr. Dan Humphreys  CHIEF COMPLAINT: Weakness.   REASON FOR CONSULTATION: Requested for evaluation of junctional bradycardia.   HISTORY OF PRESENT ILLNESS: The patient is an 81 year old gentleman referred for evaluation of junctional bradycardia. The patient has a history of atrial fibrillation, currently on warfarin. The patient has had a 2 to 3 day history of black stools. The patient was brought to the Emergency Room where he was complaining chiefly of abdominal and chest discomfort with generalized weakness. The patient was found to have a hemoglobin of 6.5. Troponin was borderline elevated at 0.2. The patient has been intermittently in junctional rhythm as well as junctional rhythm with ventricular bigeminy. The patient had digoxin level of 3.5. The patient has been treated with FFP, Digibind and currently is receiving blood transfusions.   PAST MEDICAL HISTORY: 1. Atrial fibrillation, on warfarin.  2.  Hypertension.  3.  COPD.  4.  Benign prostatic hypertrophy. 5.  Gout. 6.  Osteoarthritis.   MEDICATIONS: 1.  Coumadin 2.5 mg alternating with 1-1/2 tablets every other day. 2.  Ramipril 10 mg daily.  3.  Imdur 10 mg b.i.d.  4.  Furosemide 80 mg daily.  5.  Digoxin 0.25 mg daily.  6.  Crestor 40 mg daily.  7.  Ventolin inhaler 2 puffs p.r.n.  8.  Tylenol 2 tabs daily.  9.  Tramadol 50 mg q. 8 p.r.n.  10.  Spiriva 18 mcg capsule daily.  11.  Paroxetine 20 mg daily.  12.  Benadryl 1 tab t.i.d.  13.  Alprazolam 0.25 mg t.i.d.  14.  Allopurinol 300 mg daily.  15.  Combivent inhaler t.i.d.  16.  Vicodin 5/500 mg 1 q. 6 p.r.n.   SOCIAL HISTORY: The patient currently lives with his daughter and her husband. Daughter has power of attorney. The patient has remote tobacco abuse history.    FAMILY HISTORY: No immediate family history for coronary artery disease or myocardial infarction.   REVIEW OF SYSTEMS: CONSTITUTIONAL: The patient has had generalized decline with weight loss, recent weakness.  Denies fever or chills.  EYES: No blurry vision.  EARS: No hearing loss.  RESPIRATORY: No shortness of breath.  CARDIOVASCULAR: The patient does appear to have chest discomfort radiating from his mid epigastric area.  GASTROENTEROLOGY: The patient complained of abdominal discomfort with nausea.  GENITOURINARY: No dysuria or hematuria.  ENDOCRINE: No polyuria or polydipsia.  MUSCULOSKELETAL: No arthralgias or myalgias.  NEUROLOGICAL: No focal muscle weakness or numbness.  PSYCHOLOGICAL: The patient does have depression.   PHYSICAL EXAMINATION: VITAL SIGNS: Blood pressure 103/37, pulse 62, respirations 28, pulse ox 100%, and temperature 98.6.  HEENT: Pupils equal and reactive to light and accommodation.  NECK: Supple without thyromegaly.  LUNGS: Clear.  HEART: Normal JVP.  Normal PMI. Bradycardia with frequent ectopy. Normal S1, S2. No appreciable gallop, murmur, or rub.  ABDOMEN: Soft and nontender.  EXTREMITIES:  Pulses were intact bilaterally.  MUSCULOSKELETAL: Normal muscle tone.  NEUROLOGIC: The patient is alert and oriented x 3. Motor and sensory are both grossly intact.   IMPRESSION: This is an 81 year old gentleman with known atrial fibrillation who presents with gastrointestinal bleed with marked anemia as well as probable digitoxin toxicity which has been reversed with Digibind. The patient has borderline elevated troponin which is likely demand/supply ischemia and not due to acute  coronary syndrome. The patient is predominantly in junctional rhythm with ventricular bigeminy, which is commonly seen in digitoxin toxicity although digoxin level today is normal.   RECOMMENDATIONS: 1.  Agree with overall current therapy.  2.  Continue to correct anemia.  3.  Important to  replete potassium and magnesium.  4.  Would defer temporary transvenous pacemaker at this time until the patient has been adequately transfused and continues to demonstrate symptomatic bradycardia. ____________________________ Marcina MillardAlexander Akima Slaugh, MD ap:sb D: 02/18/2013 12:32:49 ET T: 02/18/2013 13:13:10 ET JOB#: [id number]  cc: Marcina MillardAlexander Calyn Sivils, MD, <Dictator> Marcina MillardALEXANDER Harvel Meskill MD ELECTRONICALLY SIGNED 03/23/2013 13:42

## 2015-03-04 NOTE — Consult Note (Signed)
Pt CC: GI bleed from coumadin.  Pt asleep at this time.  Chest exam with decent air flow without wheeze.  ate all of full liquid breakfast per nurse.  Hgb 8, plt 176, WBC 14.4.  Pt still on levophed.  Will advance diet tomorrow.  Electronic Signatures: Scot JunElliott, Robert T (MD)  (Signed on 12-Apr-14 12:22)  Authored  Last Updated: 12-Apr-14 12:22 by Scot JunElliott, Robert T (MD)

## 2015-03-04 NOTE — Consult Note (Signed)
PATIENT NAME:  Robert Harper, Robert Harper MR#:  [id number] DATE OF BIRTH:  [birth year]  DATE OF CONSULTATION:  02/18/2013  CONSULTING PHYSICIAN:  Scot Jun, MD  CHIEF COMPLAINT: Gastrointestinal bleeding.  HISTORY OF PRESENT ILLNESS: The patient is an 81 year old white male who presented to the ER with melena for 3 days, chest pain, generalized weakness, nausea, abdominal pain, generalized body aches. He was found to have a hemoglobin of 6.5 and a very elevated INR of 5, with a pro time of 43.8. He was also found to have a very elevated BUN to creatinine ratio. All this indicated a GI bleed, probable upper. I was asked to see him in consultation. He was also found to have a junctional rhythm in the Emergency Room and an elevated digoxin level of 3.5.   Since admission to the hospital, he has received 1 unit of blood, 1 unit of FFP and 3 liters of fluids in the ER.   PAST MEDICAL HISTORY:  1. Hypertension.  2. COPD.  3. BPH. 4. Gout. 5. Osteoarthritis.  6. Atrial fibrillation, on chronic anticoagulation.  PAST SURGICAL HISTORY: Prostate surgery.   ALLERGIES: MORPHINE, SOME LAUNDRY SOAPS.   HOME MEDICATIONS:  1. Coumadin 2.5 mg 1 tablet alternating with 1-1/2 tablets every other day. 2. Ventolin 2 puffs p.r.n.  3. Tylenol 2 tablets once a day.  4. Tramadol 50 mg q.8 hours. 5. Spiriva 18 mcg 1 capsule a day.  6. Ramipril 10 mg a day.  7. Paroxetine  20 mg a day. 8. Imdur 10 mg 1 tablet twice a day. 9. Lasix 40 mg 2 tablets a day.  10. Digoxin 0.25 mcg once a day. 11. Crestor 20 mg 2 tablets once a day. 12. Benadryl 25 mg 1 tablet 3 times daily.  13. Alprazolam 0.25 mg 3 times a day.  14. Allopurinol 300 mg a day.  15. Combivent inhalers 3 times a day. 16. Vicodin 5/500 one tablet every 6 hours as needed.   SOCIAL HISTORY: Smoked in the past, quit 35 years ago.   FAMILY HISTORY: Diabetes.   REVIEW OF SYSTEMS: Unable to obtain. The patient was given Ativan in the ER and now is  unable to answer questions.   PHYSICAL EXAMINATION:  GENERAL: Elderly frail-looking white male, sitting up in bed, obvious wheezing.  HEENT: Sclerae nonicteric. Conjunctivae pale. Tongue is pale.  VITAL SIGNS: Blood pressure 89/38, temperature 98.3, pulse 56, respirations 24.  HEAD: Atraumatic. He has audible wheezes.  NECK: Shows prominent neck veins which go down with inspiration. Neck is supple. CHEST: Shows inspiratory and expiratory wheezes. He had clear lungs on initial exam by the hospitalist who admitted him.  HEART: Shows irregular rhythm. No murmurs heard.  ABDOMEN: Soft, nontender. No hepatosplenomegaly.  EXTREMITIES: 1+ edema, 2+ pulses.  NEUROLOGIC: The patient is lethargic, unable to answer questions very well at all.   LABORATORY DATA: Pro time 43.8, INR of 5. Glucose 130, BUN 103, creatinine 1.9, sodium 133, potassium 3.7, chloride 95, CO2 27, total protein 6.3, albumin 3.5, total bilirubin 0.3, alkaline phosphatase 58, SGOT 35, SGPT 28. Troponin is 0.26, CPK-MB 11. TSH 5.54. Digoxin level 3.2; a repeat 8 hours later 0.2. White count on admission 13.5, hemoglobin 6.5, platelet count 211; repeated 8 hours later, white count 12, hemoglobin 6.2, platelet count 156. O negative blood with a negative antibody screen. Urinalysis is essentially unremarkable. EKG shows what looks to be significant ST depression in the lateral leads as well as a junctional rhythm.  IMPRESSION: Currently, the patient is on Levophed drip as well as a Protonix drip. With his new-onset wheezing after hydration and prominent neck veins sitting straight up, I have ordered 20 of Lasix and for him to get a Combivent inhalation treatment now and q.4 p.r.n. He likely has upper gastrointestinal bleeding in association with over-anticoagulation, probably because the patient did not get his blood test done. He also has elevated troponin with significant ST depression in lateral leads, which may indicate significant  ischemia because of his anemia.   PLAN:  1. Agree with transfusions of blood and FFP and vitamin K.  2. Agree with Protonix drip.  3. Recommend a cardiology consultation.  4. Recommend following his hemoglobin as well as his pro time.  5. His elevated BUN to creatinine ratio certainly indicates likelihood of upper GI bleeding. Given his condition and his age, he is very high risk for mortality for sudden death during this hospitalization. I will follow with you, consider upper endoscopy when cleared by cardiology.    ____________________________ Scot Junobert T. Aquila Menzie, MD rte:OSi D: 02/18/2013 07:01:16 ET T: 02/18/2013 07:13:15 ET JOB#: [id number]  cc: Scot Junobert T. Sammy Douthitt, MD, <Dictator> John B. Danne HarborWalker III, MD Scot JunOBERT T Shaleigh Laubscher MD ELECTRONICALLY SIGNED 03/25/2013 14:11

## 2015-03-04 NOTE — Discharge Summary (Signed)
PATIENT NAME:  Robert Harper, Robert Harper MR#:  [id number] DATE OF BIRTH:  1928-02-21  DATE OF ADMISSION:  02/18/2013 DATE OF DISCHARGE:  02/25/2013  HISTORY OF PRESENT ILLNESS: The patient is an 81 year old white gentleman with a long history of chronic atrial fibrillation, on Coumadin, who presented to the Emergency Room complaining of generalized weakness. The patient reported having black stools. He was found to be overanticoagulated and have a hemoglobin of 6.5. He was also found to be dig toxic. The patient had a history as an outpatient of not following up for his pro times. In the Emergency Room, the patient was given IV fluids, fresh frozen plasma and packed cells. He was admitted to the ICU for further evaluation.   PAST MEDICAL HISTORY: Notable for hypertension, chronic obstructive pulmonary disease, benign prostatic hypertrophy, history of gout, osteoarthritis, chronic atrial fibrillation.   PAST SURGICAL HISTORY: Notable for previous prostate surgery.   ALLERGIES: THE PATIENT WAS NOTED TO BE INTOLERANT OF MORPHINE.   ADMISSION PHYSICAL EXAMINATION:  GENERAL: An elderly, frail-appearing gentleman, who was lethargic.  VITAL SIGNS: Temperature 98.5, pulse 88, blood pressure 88/38 and respiratory rate of 20. CHEST: Examination as described by the admitting physician was notable for an increased AP diameter to the chest.  HEART: The pulse was irregular, but no murmurs were appreciated.  ABDOMEN: Soft and nontender.   The remainder of the examination was basically unremarkable.   LABORATORY DATA: The patient's admission CBC showed a hemoglobin of 6.2 with a hematocrit of 18.7. White count was 12,000. Platelet count was 156,000. Admission dig level was 3.2. Admission comprehensive metabolic panel showed a random blood sugar of 130. BUN was 103 with a creatinine of 1.92. Sodium was 133 with a chloride of 95. Total protein was 6.3. Estimated GFR was 32. Troponin was 0.26. TSH was 5.5.   HOSPITAL  COURSE: The patient was admitted to the ICU, where he was continued on IV fluids. He was started on Levophed to support his blood pressure. Digoxin was held. The patient eventually needed a repeat transfusion. He was placed on IV Protonix. His Coumadin was discontinued. He was seen in consultation by GI, who decided not to do upper endoscopy due to his multiple medical problems as long as his hemoglobin stabilized. The patient was also seen in consultation by cardiology. Despite the fact he remained on no rate control drugs, his rate remained stable throughout his hospitalization. He did remain relatively hypotensive throughout his hospitalization, although he was never diaphoretic or lightheaded. He was eventually transferred to the floor, where he was evaluated by PT.   The patient's final chemistry panel showed a blood sugar of 123, a BUN of 18 and a creatinine of 1.05. Sodium was 133, but the electrolytes were otherwise unremarkable. The patient's hemoglobin on the day of transfer was 8.6.   DISCHARGE DIAGNOSES: 1.  Acute upper gastrointestinal bleed.  2.  Profound anemia secondary to #1.  3.  Chronic atrial fibrillation.  4.  Chronic obstructive pulmonary disease.   DISCHARGE MEDICATIONS: 1.  Combivent 1 puff 4 times daily.  2.  Spiriva 1 puff daily.  3.  Tramadol 50 mg every 6 hours as needed.  4.  Protonix 40 mg b.i.d.  5.  Lasix 40 mg daily.  6.  O2 at 2 L/min.   DISCHARGE DISPOSITION: The patient is being discharged on a low sodium diet with activity as tolerated. The patient will continue to receive physical therapy. The patient is felt to be a poor candidate for  reanticoagulation.   At this time, it is not known whether the patient will be a short-term or a long-term stay.   CODE STATUS: The patient does remain a FULL CODE.   ____________________________ Letta PateJohn B. Danne HarborWalker III, MD [id number] D: 02/25/2013 13:05:46 ET T: 02/25/2013 13:12:13 ET JOB#: [id number]  cc: Jonny RuizJohn B. Danne HarborWalker III,  MD, <Dictator> Elmo PuttJOHN B WALKER III MD ELECTRONICALLY SIGNED 02/26/2013 17:12
